# Patient Record
Sex: Male | Born: 1970 | Race: Black or African American | Hispanic: No | Marital: Married | Smoking: Never smoker
Health system: Southern US, Community
[De-identification: ages and names within clinical notes are randomized; demographics above are authoritative.]

## PROBLEM LIST (undated history)

## (undated) DIAGNOSIS — K219 Gastro-esophageal reflux disease without esophagitis: Secondary | ICD-10-CM

## (undated) DIAGNOSIS — I10 Essential (primary) hypertension: Secondary | ICD-10-CM

---

## 2008-04-20 ENCOUNTER — Emergency Department: Payer: Self-pay | Admitting: Emergency Medicine

## 2008-08-21 ENCOUNTER — Emergency Department: Payer: Self-pay | Admitting: Emergency Medicine

## 2010-05-27 IMAGING — CR DG CHEST 2V
1 series · 2 of 2 positions shown · non-contrast
Comparison: none

REASON FOR EXAM: dizzy  HTN
COMMENTS:

[Series 1: view not recorded · 0.17mm/px · 2 of 2 slices shown]
[im 1/2]
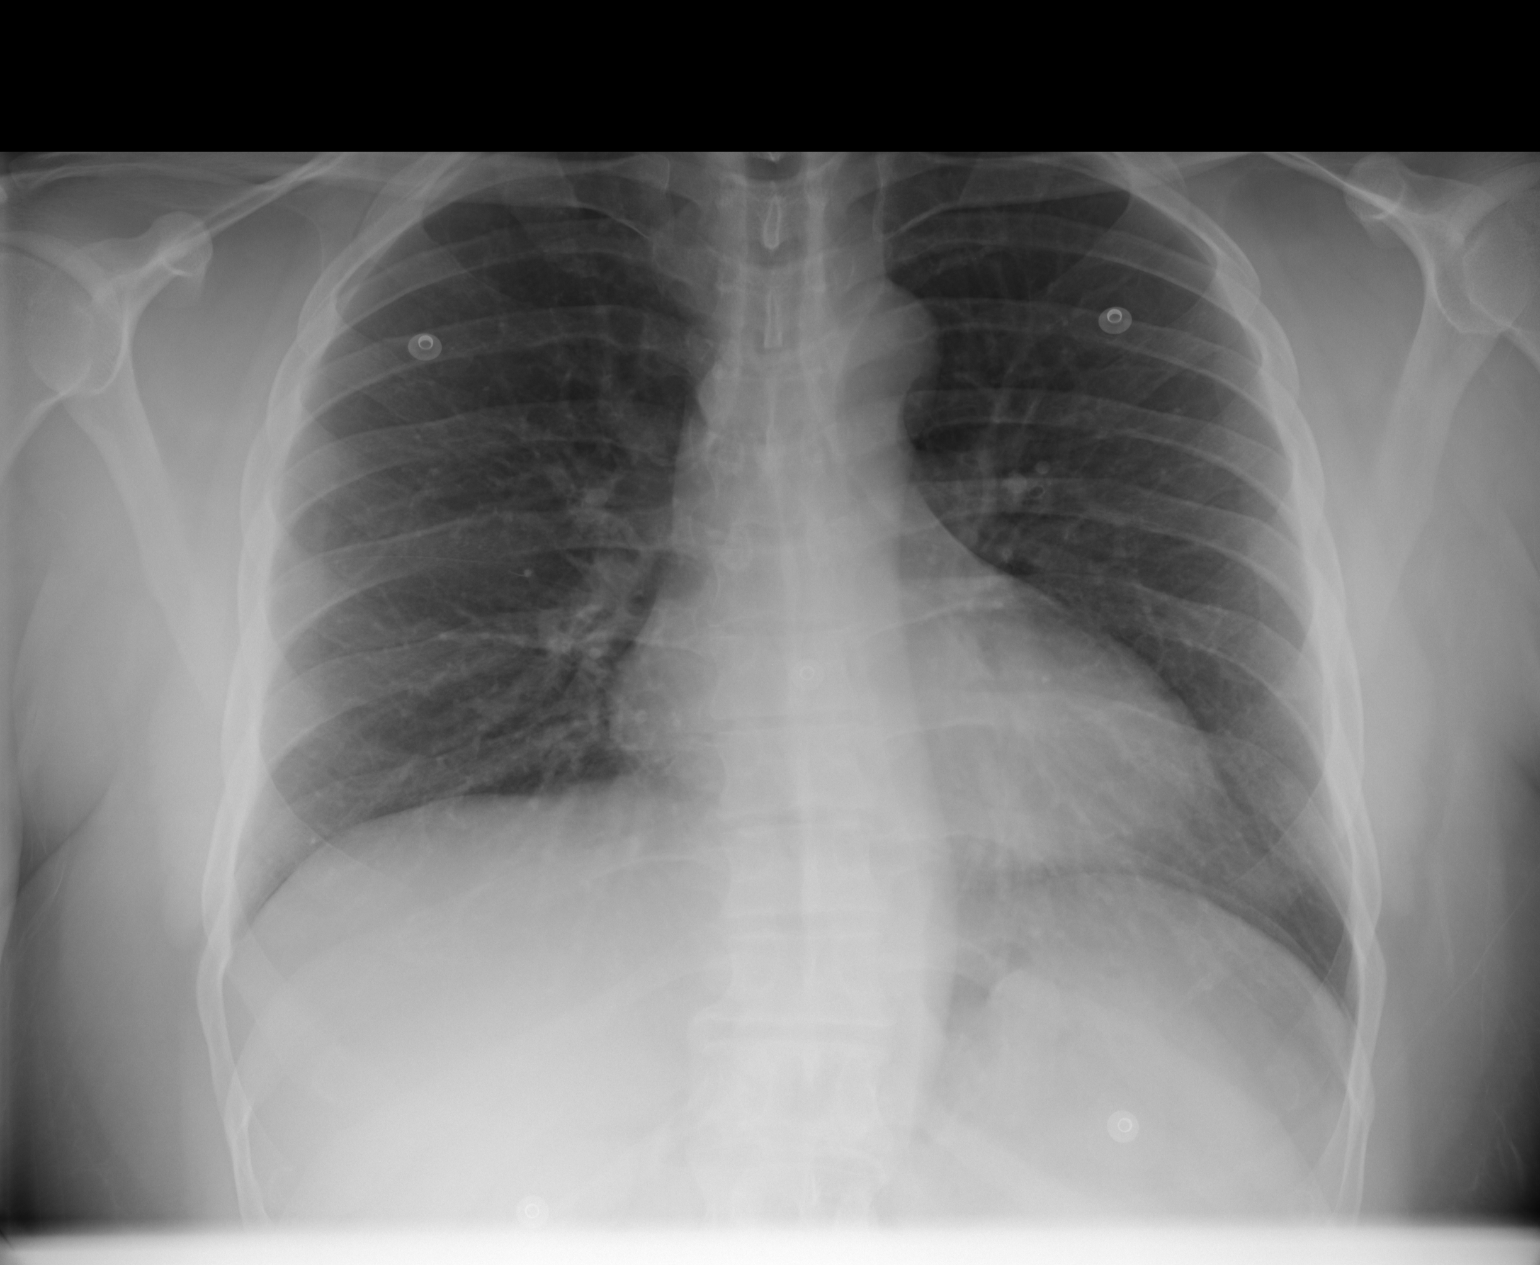
[im 2/2]
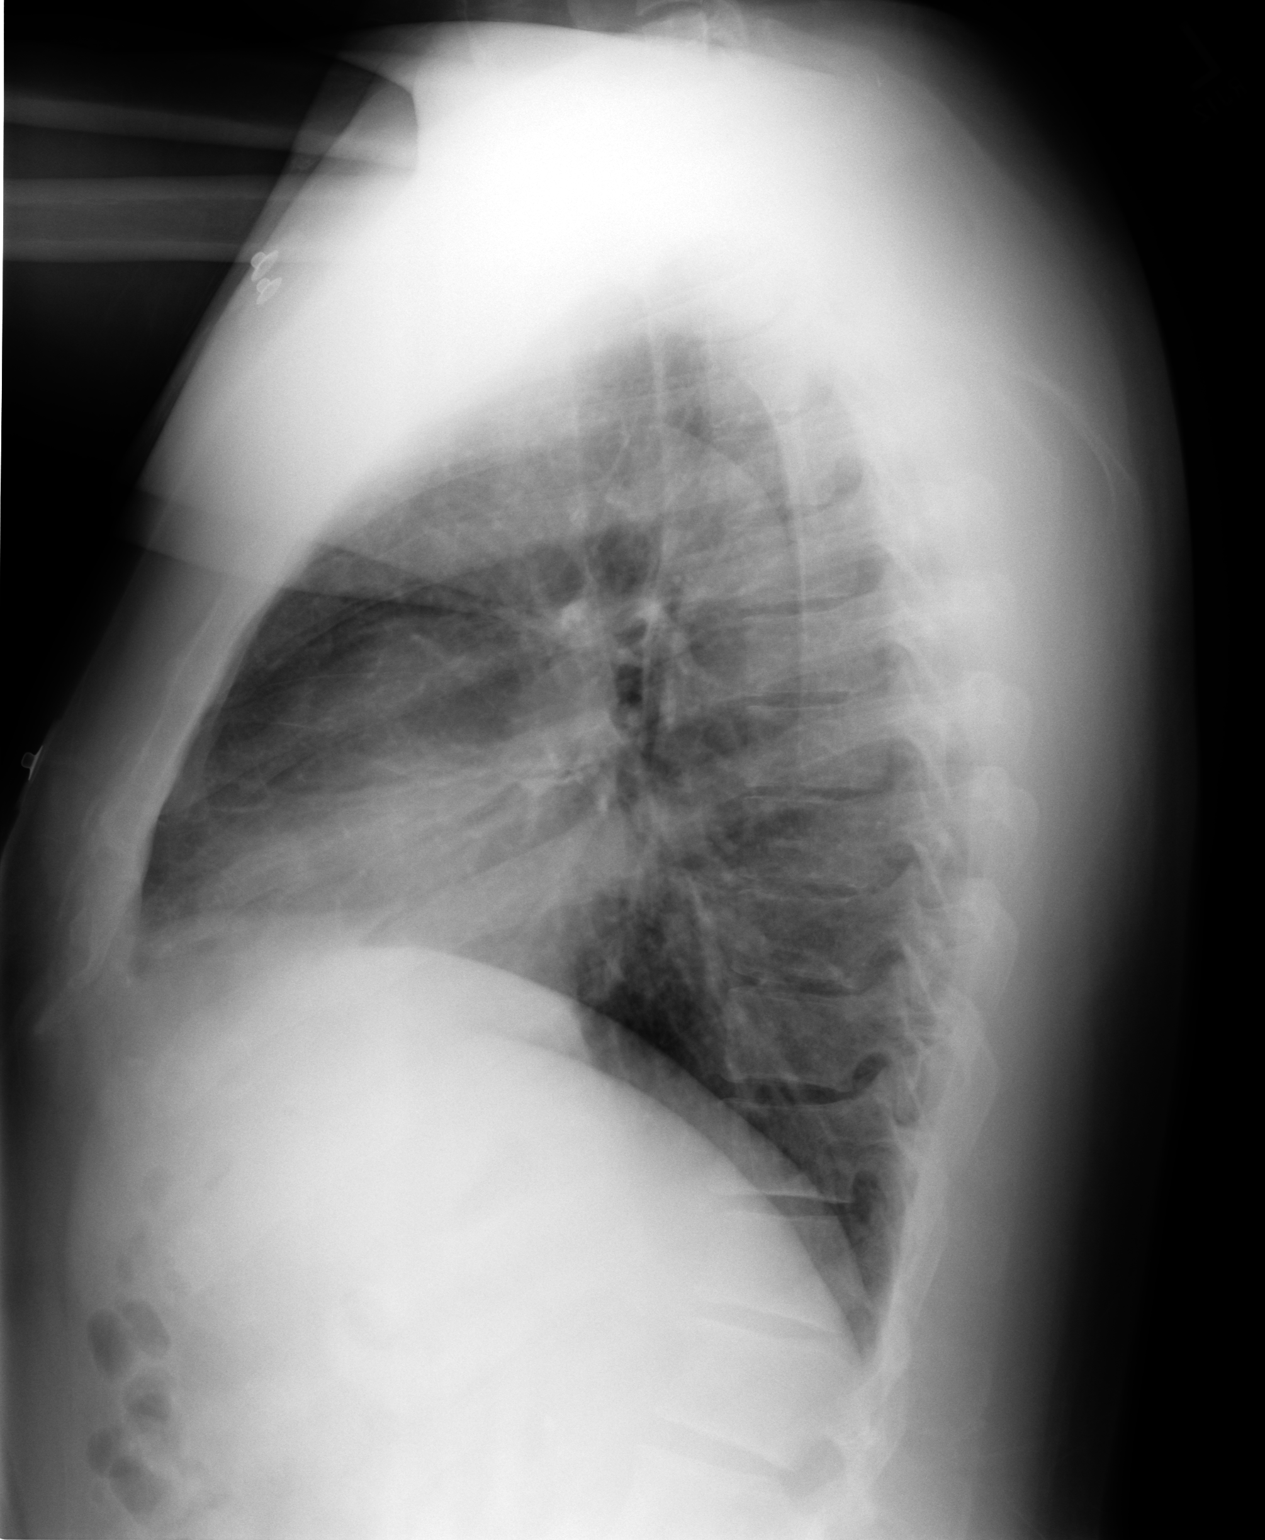

[2 of 2 positions shown; findings below may reference images not displayed]

PROCEDURE:     DXR - DXR CHEST PA (OR AP) AND LATERAL  - August 21, 2008 [DATE]

RESULT:     PA and lateral views of the chest are compared to a prior exam
of 04/20/2008. The lung fields are clear. No pneumonia, pneumothorax or
pleural effusion is seen. The heart is upper limits for normal in size or
slightly enlarged. The mediastinal and osseous structures show no acute
changes.
IMPRESSION: 1. The lung fields are clear.
2. The heart is upper limits for normal in size or slightly enlarged.

## 2015-04-07 ENCOUNTER — Emergency Department: Payer: BLUE CROSS/BLUE SHIELD

## 2015-04-07 ENCOUNTER — Emergency Department
Admission: EM | Admit: 2015-04-07 | Discharge: 2015-04-07 | Disposition: A | Discharge status: Home / Self Care | Payer: BLUE CROSS/BLUE SHIELD | Attending: Student | Admitting: Student

## 2015-04-07 ENCOUNTER — Encounter: Payer: Self-pay | Admitting: Emergency Medicine

## 2015-04-07 DIAGNOSIS — R0602 Shortness of breath: Secondary | ICD-10-CM | POA: Diagnosis not present

## 2015-04-07 DIAGNOSIS — R079 Chest pain, unspecified: Secondary | ICD-10-CM | POA: Insufficient documentation

## 2015-04-07 DIAGNOSIS — R000 Tachycardia, unspecified: Secondary | ICD-10-CM | POA: Diagnosis not present

## 2015-04-07 DIAGNOSIS — R197 Diarrhea, unspecified: Secondary | ICD-10-CM | POA: Diagnosis not present

## 2015-04-07 DIAGNOSIS — I10 Essential (primary) hypertension: Secondary | ICD-10-CM | POA: Diagnosis not present

## 2015-04-07 DIAGNOSIS — Z79899 Other long term (current) drug therapy: Secondary | ICD-10-CM | POA: Diagnosis not present

## 2015-04-07 HISTORY — DX: Essential (primary) hypertension: I10

## 2015-04-07 LAB — TROPONIN I: Troponin I: <0.03 ng/mL (ref <0.031)

## 2015-04-07 LAB — FIBRIN DERIVATIVES D-DIMER (ARMC ONLY): FIBRIN DERIVATIVES D-DIMER (ARMC): 233 (ref 0–499)

## 2015-04-07 LAB — BASIC METABOLIC PANEL
ANION GAP: 7 (ref 5–15)
BUN: 17 mg/dL (ref 6–20)
CO2: 22 mmol/L (ref 22–32)
CREATININE: 1.23 mg/dL (ref 0.61–1.24)
Calcium: 9 mg/dL (ref 8.9–10.3)
Chloride: 104 mmol/L (ref 101–111)
GLUCOSE: 159 mg/dL — AB (ref 65–99)
Potassium: 3.2 mmol/L — ABNORMAL LOW (ref 3.5–5.1)
Sodium: 133 mmol/L — ABNORMAL LOW (ref 135–145)

## 2015-04-07 LAB — CBC
HCT: 49 % (ref 40.0–52.0)
HEMOGLOBIN: 16.2 g/dL (ref 13.0–18.0)
MCH: 25.5 pg — ABNORMAL LOW (ref 26.0–34.0)
MCHC: 33.1 g/dL (ref 32.0–36.0)
MCV: 76.9 fL — ABNORMAL LOW (ref 80.0–100.0)
Platelets: 197 10*3/uL (ref 150–440)
RBC: 6.38 MIL/uL — ABNORMAL HIGH (ref 4.40–5.90)
RDW: 15.5 % — ABNORMAL HIGH (ref 11.5–14.5)
WBC: 7.8 10*3/uL (ref 3.8–10.6)

## 2015-04-07 MED ORDER — SODIUM CHLORIDE 0.9 % IV BOLUS (SEPSIS)
1000.0000 mL | Freq: Once | INTRAVENOUS | Status: AC
  Administered 2015-04-07: 1000 mL via INTRAVENOUS

## 2015-04-07 MED ORDER — ASPIRIN 81 MG PO CHEW
324.0000 mg | CHEWABLE_TABLET | Freq: Once | ORAL | Status: AC
  Administered 2015-04-07: 324 mg via ORAL
  Filled 2015-04-07: qty 4

## 2015-04-07 NOTE — ED Notes (Signed)
Patient return from x-ray 

## 2015-04-07 NOTE — ED Notes (Signed)
Pt had episode of central chest pain described as pulling feeling.  Lasted about 10 minutes with shob as well.  Pain has now resolved.  Also had nausea with episode.

## 2015-04-07 NOTE — ED Provider Notes (Signed)
[facility] Emergency Department Provider Note  ____________________________________________  Time seen: Approximately 2:03 PM  I have reviewed the triage vital signs and the nursing notes.   HISTORY  Chief Complaint Chest Pain    HPI Andrew Hansen is a 45 y.o. male hypertension, GERD, obesity who presents for evaluation of gradual onset central dull/pulling nonradiating nonexertional chest pain which began at approximately 1:30 PM, lasted 10 minutes, is now resolved, no modifying factors. The patient reports that at approximately 1:30 PM, he was shaving when he developed this chest pain. He reports that he also developed stomach upset and had an episode of nonbloody diarrhea. He felt somewhat short of breath during this time. His pain is since resolved. He reports that in the past he had a more severe episode of chest pain in the setting of dehydration but reports that he has been maintaining adequate hydration recently. He does have a family history of early coronary artery disease. No personal or family history of DVT or PE.   Past Medical History  Diagnosis Date  . Hypertension     There are no active problems to display for this patient.   History reviewed. No pertinent past surgical history.  Current Outpatient Rx  Name  Route  Sig  Dispense  Refill  . amLODipine (NORVASC) 5 MG tablet   Oral   Take 5 mg by mouth daily.      2   . lisinopril (PRINIVIL,ZESTRIL) 20 MG tablet   Oral   Take 20 mg by mouth 2 (two) times daily.      2   . omeprazole (PRILOSEC) 20 MG capsule   Oral   Take 20 mg by mouth daily as needed. For heartburn/Indigestion.      2     Allergies Other  History reviewed. No pertinent family history.  Social History Social History  Substance Use Topics  . Smoking status: Never Smoker   . Smokeless tobacco: None  . Alcohol Use: Yes    Review of Systems Constitutional: No fever/chills Eyes: No visual  changes. ENT: No sore throat. Cardiovascular: + chest pain. Respiratory: + shortness of breath. Gastrointestinal: No abdominal pain.  No nausea, no vomiting.  + diarrhea.  No constipation. Genitourinary: Negative for dysuria. Musculoskeletal: Negative for back pain. Skin: Negative for rash. Neurological: Negative for headaches, focal weakness or numbness.  10-point ROS otherwise negative.  ____________________________________________   PHYSICAL EXAM:  VITAL SIGNS: ED Triage Vitals  Enc Vitals Group     BP 04/07/15 1359 133/92 mmHg     Pulse Rate 04/07/15 1359 119     Resp 04/07/15 1359 20     Temp 04/07/15 1359 97.7 F (36.5 C)     Temp Source 04/07/15 1359 Oral     SpO2 04/07/15 1401 100 %     Weight 04/07/15 1358 263 lb (119.296 kg)     Height 04/07/15 1358 5\' 10"  (1.778 m)     Head Cir --      Peak Flow --      Pain Score --      Pain Loc --      Pain Edu? --      Excl. in GC? --     Constitutional: Alert and oriented. Well appearing and in no acute distress. Eyes: Conjunctivae are normal. PERRL. EOMI. Head: Atraumatic. Nose: No congestion/rhinnorhea. Mouth/Throat: Mucous membranes are moist.  Oropharynx non-erythematous. Neck: No stridor.  Cardiovascular: mildly tachycardic rate, regular rhythm. Grossly normal heart sounds.  Good peripheral circulation. Respiratory: Normal respiratory effort.  No retractions. Lungs CTAB. Gastrointestinal: Soft and nontender. No distention.  No CVA tenderness. Genitourinary: deferred Musculoskeletal: No lower extremity tenderness nor edema.  No joint effusions. Neurologic:  Normal speech and language. No gross focal neurologic deficits are appreciated. No gait instability. Skin:  Skin is warm, dry and intact. No rash noted. Psychiatric: Mood and affect are normal. Speech and behavior are normal.  ____________________________________________   LABS (all labs ordered are listed, but only abnormal results are displayed)  Labs  Reviewed  BASIC METABOLIC PANEL - Abnormal; Notable for the following:    Sodium 133 (*)    Potassium 3.2 (*)    Glucose, Bld 159 (*)    All other components within normal limits  CBC - Abnormal; Notable for the following:    RBC 6.38 (*)    MCV 76.9 (*)    MCH 25.5 (*)    RDW 15.5 (*)    All other components within normal limits  TROPONIN I  FIBRIN DERIVATIVES D-DIMER ([facility] ONLY)  TROPONIN I   ____________________________________________  EKG  ED ECG REPORT I, Gayla DossGayle, Fontaine Hehl A, the attending physician, personally viewed and interpreted this ECG.   Date: 04/07/2015  EKG Time: 13:59  Rate: 116  Rhythm: sinus tachycardia  Axis: normal  Intervals:right bundle branch block, incomplete  ST&T Change: No acute ST elevation.  EKG is unchanged when compared to 08/21/2008. ____________________________________________  RADIOLOGY  CXR IMPRESSION: Negative chest x-ray.  ____________________________________________   PROCEDURES  Procedure(s) performed: None  Critical Care performed: No  ____________________________________________   INITIAL IMPRESSION / ASSESSMENT AND PLAN / ED COURSE  Pertinent labs & imaging results that were available during my care of the patient were reviewed by me and considered in my medical decision making (see chart for details).  Andrew Hansen is a 45 y.o. male hypertension, GERD, obesity who presents for evaluation of gradual onset central dull/pulling nonradiating nonexertional chest pain which began at approximately 1:30 PM and is now resolved. On exam he was initially mild tachycardic however this resolved quickly to prior to any interventions being given. His vital signs are otherwise stable, he is afebrile. He denies any chest pain at this time. EKG is not consistent with acute ischemia and is essentially unchanged from prior. Plan for cardiac labs including 2 sets of troponins, d-dimer, chest x-ray. We'll give aspirin, reassess for  disposition.  ----------------------------------------- 6:31 PM on 04/07/2015 -----------------------------------------  The patient has remained chest pain-free throughout the duration of his stay in the emergency department. 2 sets of troponins are negative making ACS unlikely. D-dimer is not elevated, doubt PE. Because of the patient's family history of coronary artery disease I have discussed the case with Dr. Darrold JunkerParaschos of cardiology and the patient will follow up in clinic in 2 days. I discussed extensive return precautions with the patient as well as his wife and all are comfortable with the discharge plan. DC home. ____________________________________________   FINAL CLINICAL IMPRESSION(S) / ED DIAGNOSES  Final diagnoses:  Chest pain, unspecified chest pain type      Gayla DossEryka A Bennie Scaff, MD 04/07/15 [phone]

## 2015-04-07 NOTE — ED Notes (Signed)
Dr. Gayle at bedside  

## 2015-04-07 NOTE — ED Notes (Signed)
Patient transported to X-ray 

## 2016-03-09 ENCOUNTER — Encounter: Payer: Self-pay | Admitting: Emergency Medicine

## 2016-03-09 ENCOUNTER — Emergency Department
Admission: EM | Admit: 2016-03-09 | Discharge: 2016-03-09 | Disposition: A | Discharge status: Home / Self Care | Payer: 59 | Attending: Emergency Medicine | Admitting: Emergency Medicine

## 2016-03-09 ENCOUNTER — Emergency Department: Payer: 59

## 2016-03-09 DIAGNOSIS — Z79899 Other long term (current) drug therapy: Secondary | ICD-10-CM | POA: Diagnosis not present

## 2016-03-09 DIAGNOSIS — R079 Chest pain, unspecified: Secondary | ICD-10-CM | POA: Diagnosis present

## 2016-03-09 DIAGNOSIS — R531 Weakness: Secondary | ICD-10-CM | POA: Insufficient documentation

## 2016-03-09 DIAGNOSIS — I10 Essential (primary) hypertension: Secondary | ICD-10-CM | POA: Diagnosis not present

## 2016-03-09 HISTORY — DX: Gastro-esophageal reflux disease without esophagitis: K21.9

## 2016-03-09 LAB — TROPONIN I

## 2016-03-09 LAB — BASIC METABOLIC PANEL
ANION GAP: 7 (ref 5–15)
BUN: 17 mg/dL (ref 6–20)
CALCIUM: 9.1 mg/dL (ref 8.9–10.3)
CHLORIDE: 106 mmol/L (ref 101–111)
CO2: 24 mmol/L (ref 22–32)
CREATININE: 1.08 mg/dL (ref 0.61–1.24)
GLUCOSE: 112 mg/dL — AB (ref 65–99)
Potassium: 3.6 mmol/L (ref 3.5–5.1)
Sodium: 137 mmol/L (ref 135–145)

## 2016-03-09 LAB — CBC
HEMATOCRIT: 47 % (ref 40.0–52.0)
Hemoglobin: 15.7 g/dL (ref 13.0–18.0)
MCH: 25.4 pg — ABNORMAL LOW (ref 26.0–34.0)
MCHC: 33.4 g/dL (ref 32.0–36.0)
MCV: 76 fL — ABNORMAL LOW (ref 80.0–100.0)
PLATELETS: 188 10*3/uL (ref 150–440)
RBC: 6.18 MIL/uL — ABNORMAL HIGH (ref 4.40–5.90)
RDW: 14.8 % — AB (ref 11.5–14.5)
WBC: 6.5 10*3/uL (ref 3.8–10.6)

## 2016-03-09 LAB — TSH: TSH: 1.186 u[IU]/mL (ref 0.350–4.500)

## 2016-03-09 LAB — T4, FREE: FREE T4: 0.84 ng/dL (ref 0.61–1.12)

## 2016-03-09 NOTE — ED Provider Notes (Signed)
[facility] Emergency Department Provider Note        Time seen: ----------------------------------------- 3:40 PM on 03/09/2016 -----------------------------------------    I have reviewed the triage vital signs and the nursing notes.   HISTORY  Chief Complaint Weakness; Chest Pain; and Shortness of Breath    HPI Andrew Hansen is a 45 y.o. male who presents to ER for feeling weak and fatigued for the lastseveral weeks. Patient is had chest pain and shortness of breath off and on. He came here twice but did not check in because it resolved spontaneously. He was seen by his primary care doctor and referred to cardiology has had a negative echocardiogram. She denies fevers, chills, current chest pain or other complaints at this time. Patient states she is not under undue stress.   Past Medical History:  Diagnosis Date  . GERD (gastroesophageal reflux disease)   . Hypertension     There are no active problems to display for this patient.   History reviewed. No pertinent surgical history.  Allergies Other  Social History Social History  Substance Use Topics  . Smoking status: Never Smoker  . Smokeless tobacco: Never Used  . Alcohol use Yes    Review of Systems Constitutional: Negative for fever. Cardiovascular: Positive for chest pain Respiratory: Negative for shortness of breath. Gastrointestinal: Negative for abdominal pain, vomiting and diarrhea. Genitourinary: Negative for dysuria. Musculoskeletal: Negative for back pain. Skin: Negative for rash. Neurological: Negative for headaches, positive for generalized weakness  10-point ROS otherwise negative.  ____________________________________________   PHYSICAL EXAM:  VITAL SIGNS: ED Triage Vitals [03/09/16 1253]  Enc Vitals Group     BP 132/85     Pulse Rate 82     Resp 18     Temp 98.4 F (36.9 C)     Temp Source Oral     SpO2 98 %     Weight 258 lb (117 kg)     Height 5'  11" (1.803 m)     Head Circumference      Peak Flow      Pain Score      Pain Loc      Pain Edu?      Excl. in GC?     Constitutional: Alert and oriented. Well appearing and in no distress. Eyes: Conjunctivae are normal. PERRL. Normal extraocular movements. ENT   Head: Normocephalic and atraumatic.   Nose: No congestion/rhinnorhea.   Mouth/Throat: Mucous membranes are moist.   Neck: No stridor. Cardiovascular: Normal rate, regular rhythm. No murmurs, rubs, or gallops. Respiratory: Normal respiratory effort without tachypnea nor retractions. Breath sounds are clear and equal bilaterally. No wheezes/rales/rhonchi. Gastrointestinal: Soft and nontender. Normal bowel sounds Musculoskeletal: Nontender with normal range of motion in all extremities. No lower extremity tenderness nor edema. Neurologic:  Normal speech and language. No gross focal neurologic deficits are appreciated.  Skin:  Skin is warm, dry and intact. No rash noted. Psychiatric: Mood and affect are normal. Speech and behavior are normal.  ____________________________________________  EKG: Interpreted by me. Sinus rhythm rate of 82 bpm, normal PR interval, normal QT, normal axis, incomplete right bundle branch block.  ____________________________________________  ED COURSE:  Pertinent labs & imaging results that were available during my care of the patient were reviewed by me and considered in my medical decision making (see chart for details). Clinical Course   Patient is in no distress, we will assess with labs and imaging.  Procedures ____________________________________________   LABS (pertinent positives/negatives)  Labs Reviewed  BASIC METABOLIC PANEL - Abnormal; Notable for the following:       Result Value   Glucose, Bld 112 (*)    All other components within normal limits  CBC - Abnormal; Notable for the following:    RBC 6.18 (*)    MCV 76.0 (*)    MCH 25.4 (*)    RDW 14.8 (*)    All  other components within normal limits  TROPONIN I  TSH  T4, FREE    RADIOLOGY Chest x-ray is unremarkable  ____________________________________________  FINAL ASSESSMENT AND PLAN  Chest pain, weakness  Plan: Patient with labs and imaging as dictated above. No specific etiology for his symptoms. EKG and labs are not concerning, he was not orthostatic. Patient was discussed with cardiology will see him in the office for a Myoview.   Emily FilbertWilliams, Jonathan E, MD   Note: This dictation was prepared with Dragon dictation. Any transcriptional errors that result from this process are unintentional    Emily FilbertJonathan E Williams, MD 03/09/16 1616

## 2016-03-09 NOTE — ED Triage Notes (Signed)
Has had drained feeling for weeks.  Chest pain and sob on and off.  Came here twice but did not check in due to it went away.  He has been to pcp and to cardiologist but no results yet.

## 2017-01-10 IMAGING — CR DG CHEST 2V
2 series · 2 of 2 positions shown · non-contrast
Comparison: Prior chest x-ray 08/21/2008

CLINICAL DATA: 44-year-old male with central chest pain

EXAM:
CHEST  2 VIEW

[chest pa]
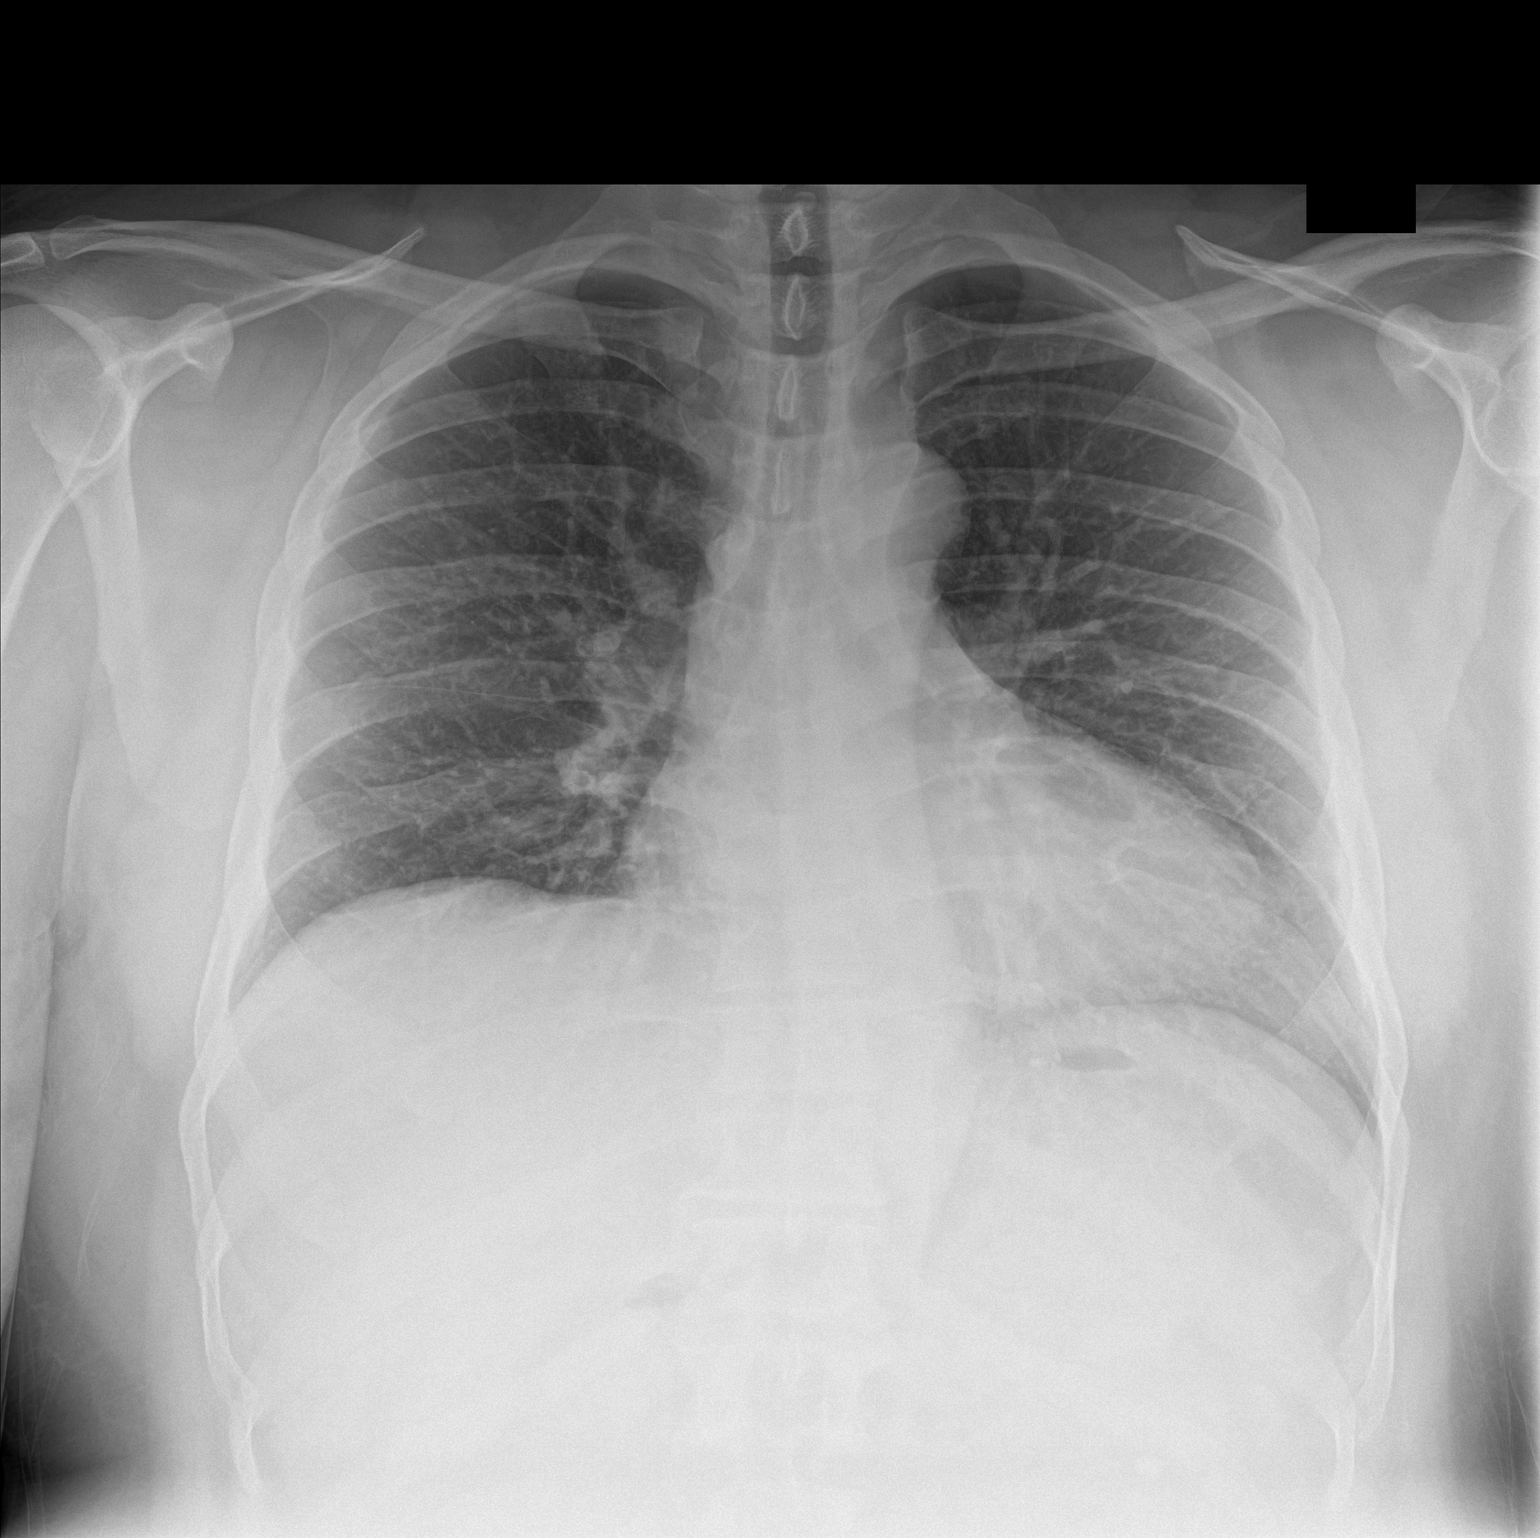

[chest lat]
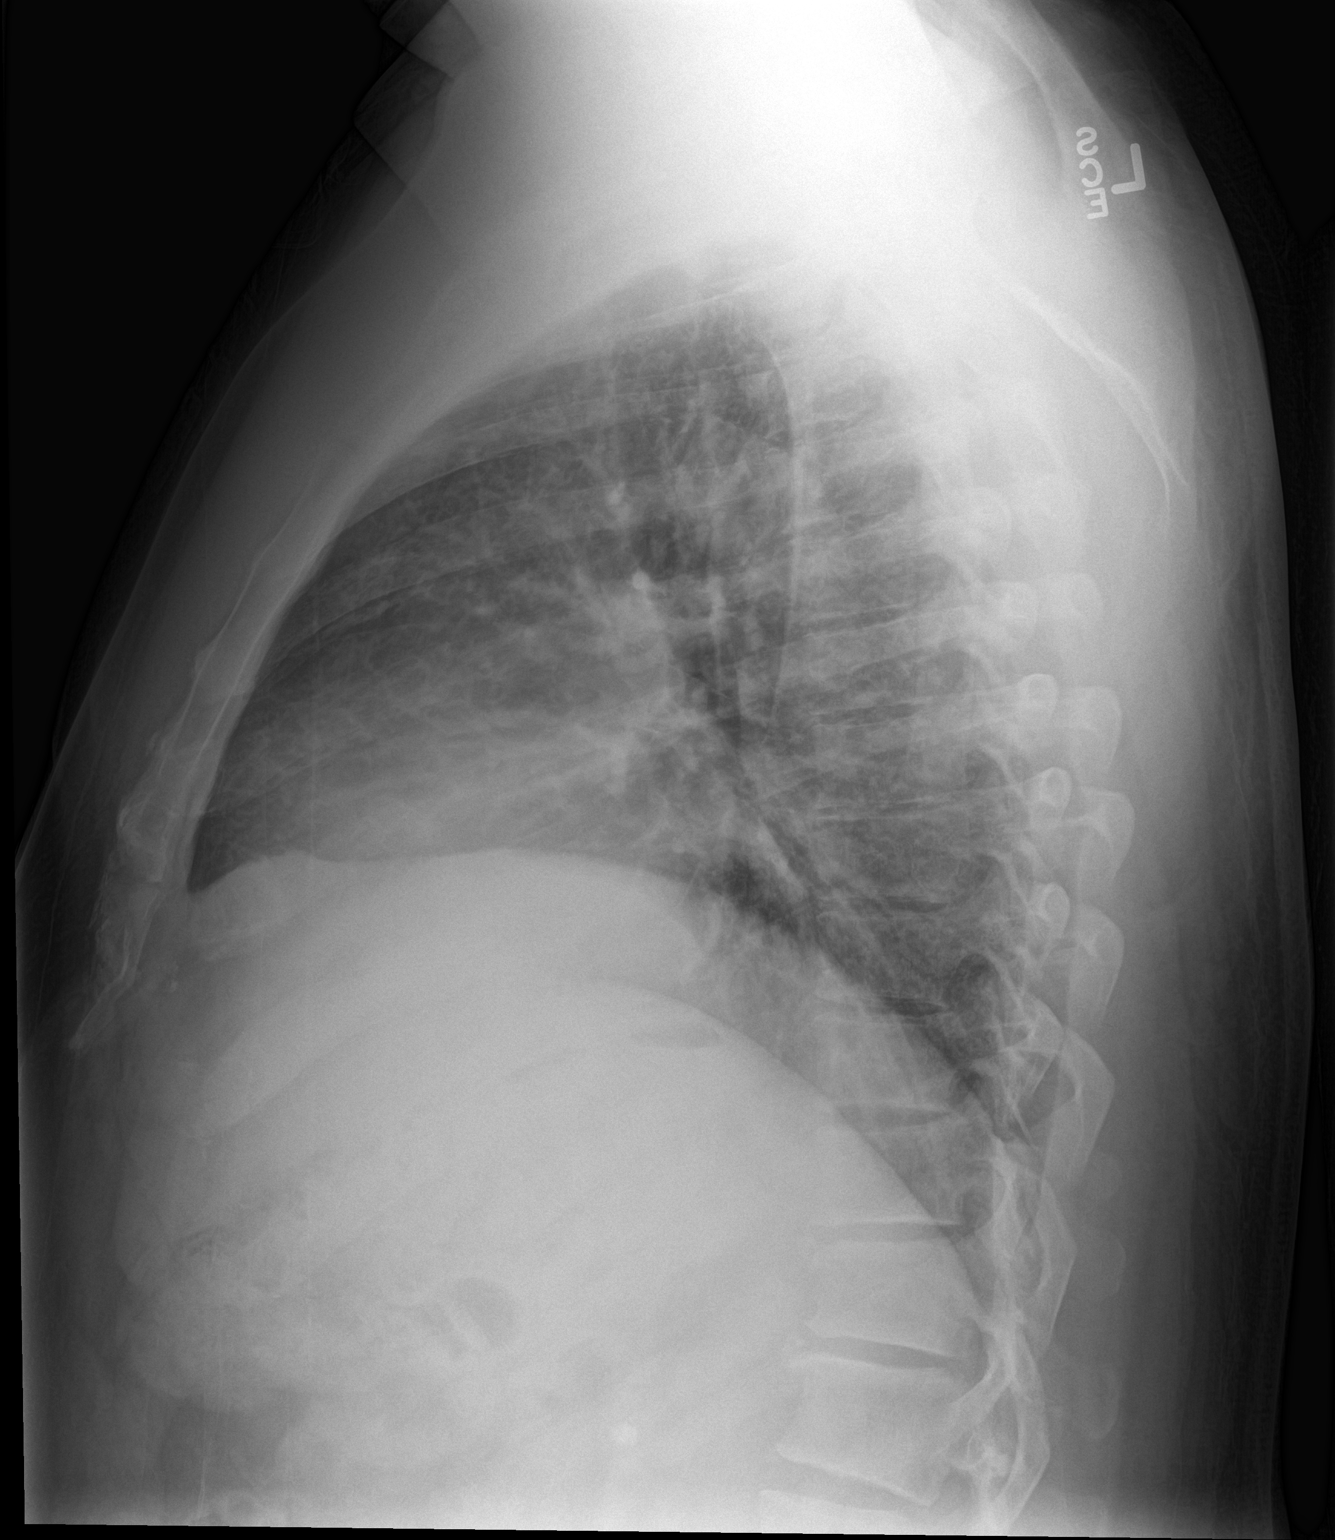

[2 of 2 positions shown; findings below may reference images not displayed]

FINDINGS: The lungs are clear and negative for focal airspace consolidation,
pulmonary edema or suspicious pulmonary nodule. No pleural effusion
or pneumothorax. Cardiac and mediastinal contours are within normal
limits. No acute fracture or lytic or blastic osseous lesions. The
visualized upper abdominal bowel gas pattern is unremarkable.
IMPRESSION: Negative chest x-ray.

## 2017-12-13 IMAGING — CR DG CHEST 2V
2 series · 2 of 2 positions shown · non-contrast
Comparison: Chest radiograph 04/07/2015

CLINICAL DATA: chest pain.  Shortness of breath

EXAM:
CHEST  2 VIEW

[chest pa]
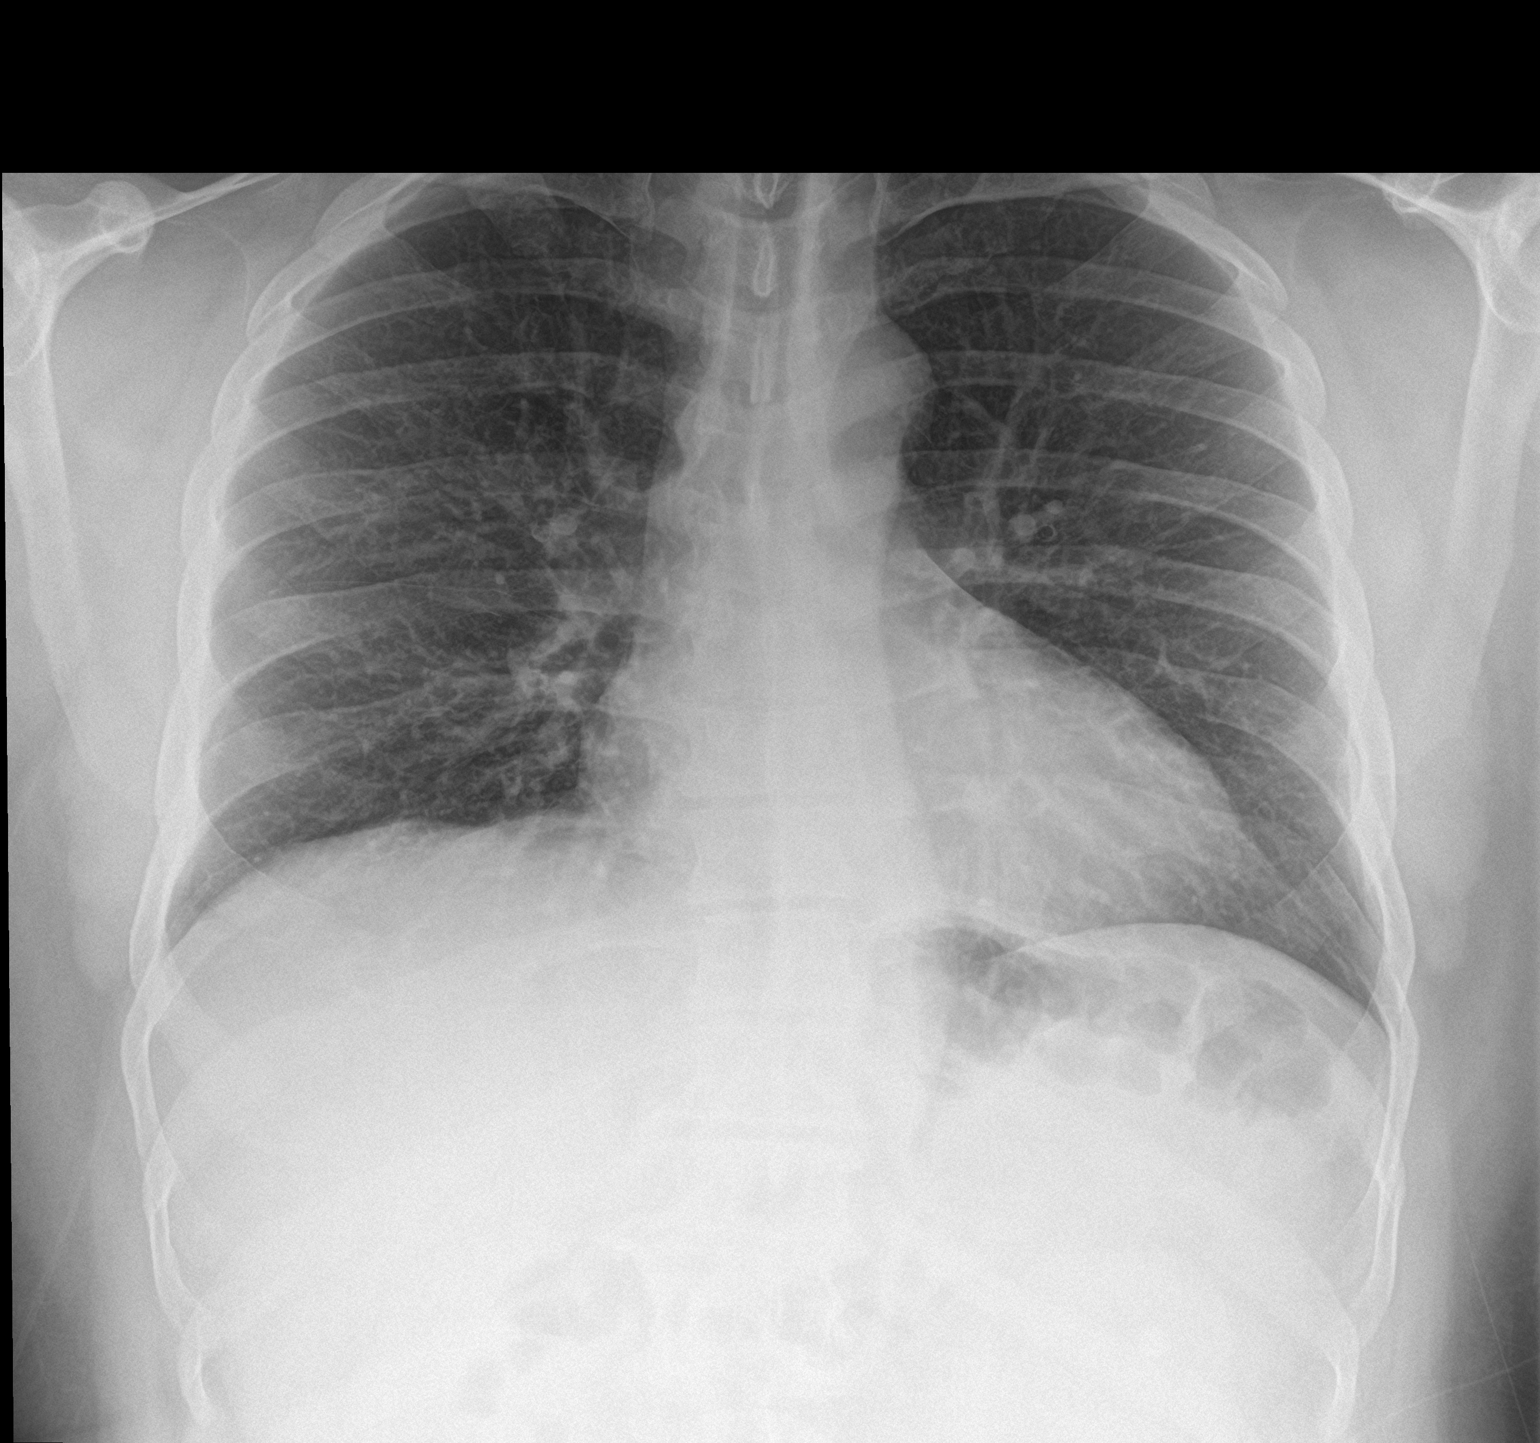

[chest lat]
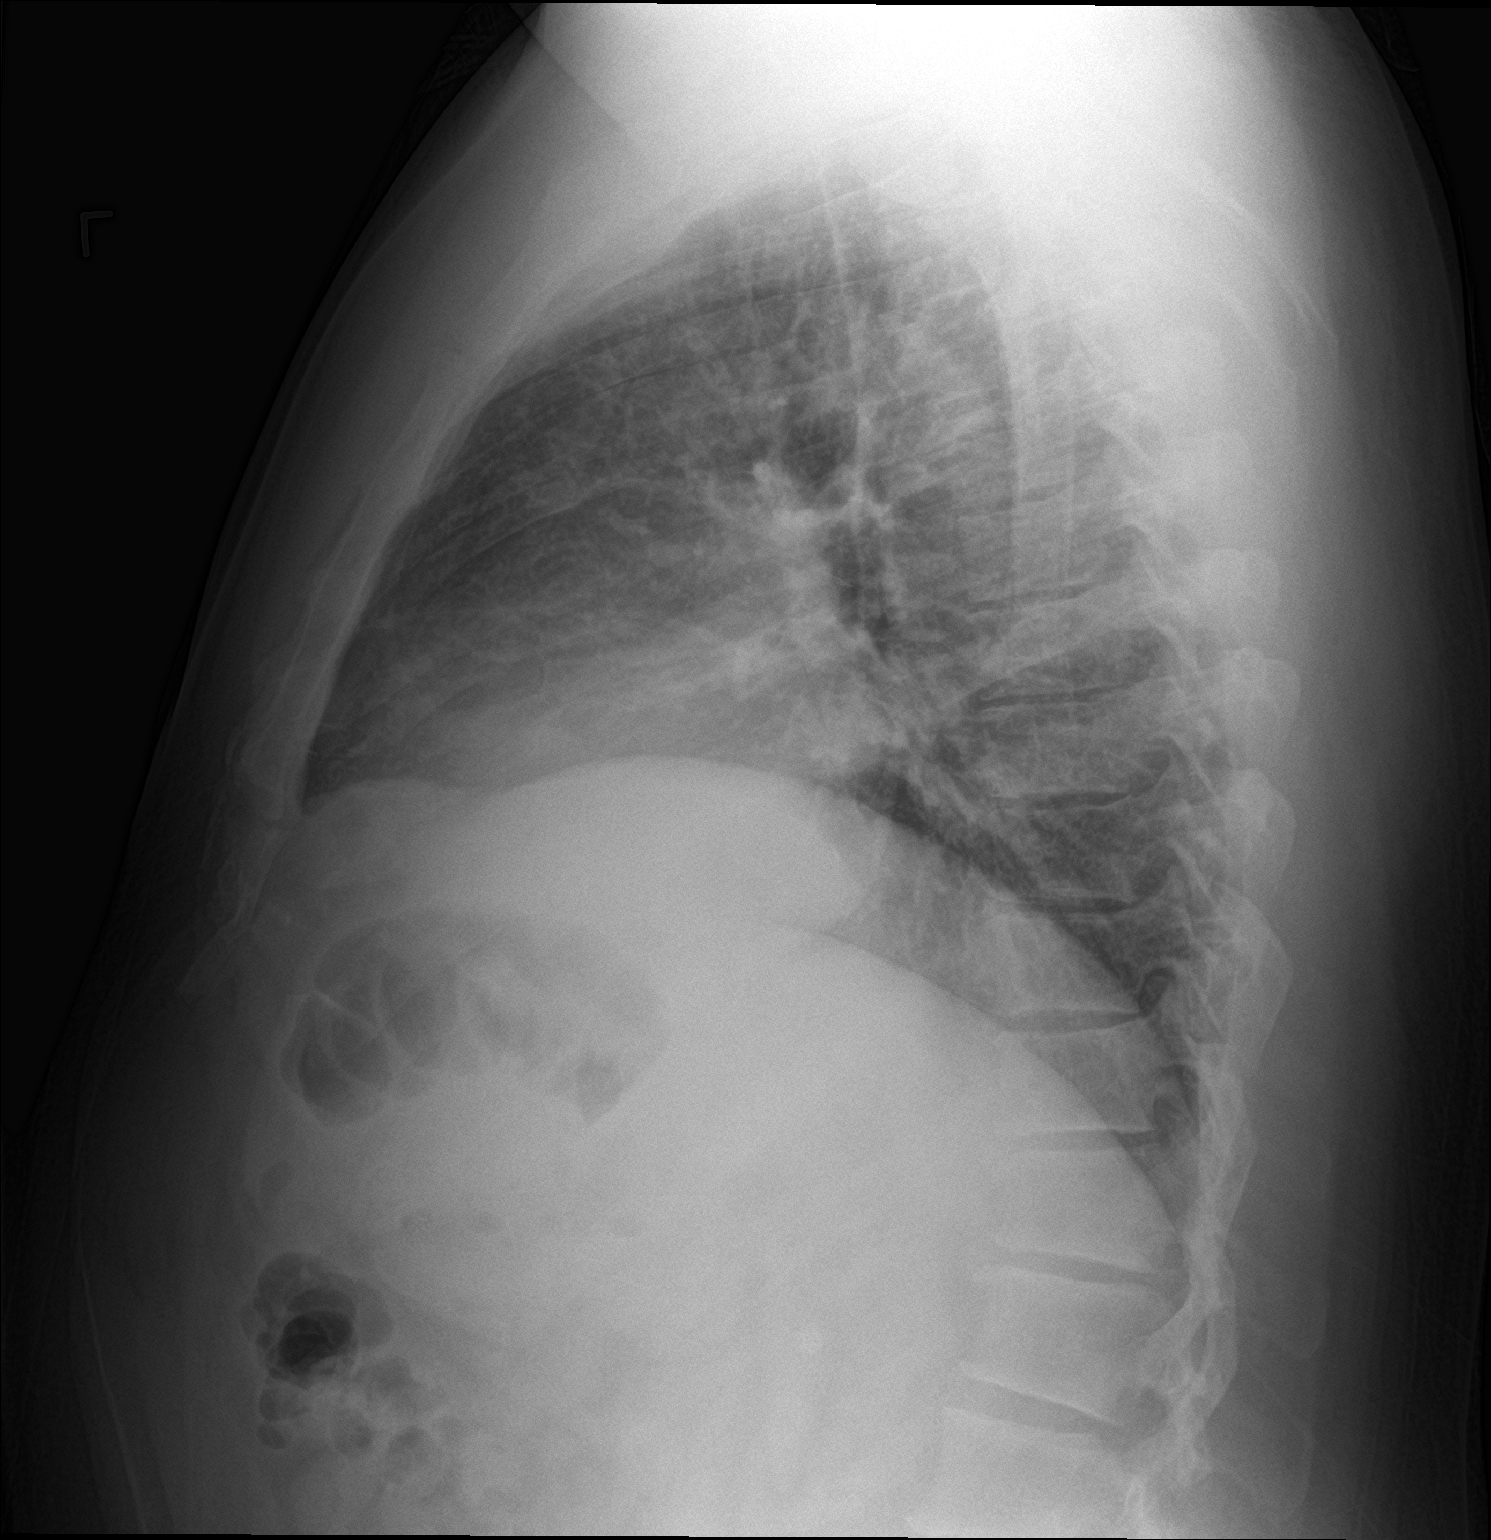

[2 of 2 positions shown; findings below may reference images not displayed]

FINDINGS: The heart size and mediastinal contours are within normal limits.
Both lungs are clear. The visualized skeletal structures are
unremarkable.
IMPRESSION: No active cardiopulmonary disease.

## 2018-10-27 ENCOUNTER — Telehealth: Payer: Self-pay

## 2018-10-27 ENCOUNTER — Other Ambulatory Visit: Payer: Self-pay

## 2018-10-27 DIAGNOSIS — Z20822 Contact with and (suspected) exposure to covid-19: Secondary | ICD-10-CM

## 2018-10-27 NOTE — Telephone Encounter (Signed)
Patient called with concerns due to his wife and son testing positive for COVID-19 and being in close contact. Patient advised to quarantine at home and self monitor. Patient requesting to be tested at this time.

## 2018-10-29 LAB — NOVEL CORONAVIRUS, NAA: SARS-CoV-2, NAA: NOT DETECTED

## 2018-10-31 ENCOUNTER — Telehealth: Payer: Self-pay

## 2018-10-31 NOTE — Telephone Encounter (Signed)
Call for update in reference to NEGATIVE COVID-19 results.

## 2019-01-03 ENCOUNTER — Ambulatory Visit: Payer: Self-pay

## 2019-01-04 ENCOUNTER — Ambulatory Visit: Payer: Self-pay

## 2019-01-04 ENCOUNTER — Other Ambulatory Visit: Payer: Self-pay

## 2019-01-04 DIAGNOSIS — Z23 Encounter for immunization: Secondary | ICD-10-CM

## 2019-01-04 NOTE — Progress Notes (Signed)
     Patient ID: Andrew Hansen, male    DOB: [birth year], 48 y.o.   MRN: [id number]    Thank you!!  Apolonio Schneiders RN  [facility]ist [place]: [phone]  Cell:  [phone] Website: [email or web address]

## 2019-01-04 NOTE — Patient Instructions (Signed)
Influenza (Flu) Vaccine (Inactivated or Recombinant): What You Need to Know 1. Why get vaccinated? Influenza vaccine can prevent influenza (flu). Flu is a contagious disease that spreads around the [place] every year, usually between October and May. Anyone can get the flu, but it is more dangerous for some people. Infants and young children, people 47 years of age and older, pregnant women, and people with certain health conditions or a weakened immune system are at greatest risk of flu complications. Pneumonia, bronchitis, sinus infections and ear infections are examples of flu-related complications. If you have a medical condition, such as heart disease, cancer or diabetes, flu can make it worse. Flu can cause fever and chills, sore throat, muscle aches, fatigue, cough, headache, and runny or stuffy nose. Some people may have vomiting and diarrhea, though this is more common in children than adults. Each year thousands of people in the [place] die from flu, and many more are hospitalized. Flu vaccine prevents millions of illnesses and flu-related visits to the doctor each year. 2. Influenza vaccine CDC recommends everyone 6 months of age and older get vaccinated every flu season. Children 6 months through 8 years of age may need 2 doses during a single flu season. Everyone else needs only 1 dose each flu season. It takes about 2 weeks for protection to develop after vaccination. There are many flu viruses, and they are always changing. Each year a new flu vaccine is made to protect against three or four viruses that are likely to cause disease in the upcoming flu season. Even when the vaccine doesn't exactly match these viruses, it may still provide some protection. Influenza vaccine does not cause flu. Influenza vaccine may be given at the same time as other vaccines. 3. Talk with your health care provider Tell your vaccine provider if the person getting the vaccine:  Has had an  allergic reaction after a previous dose of influenza vaccine, or has any severe, life-threatening allergies.  Has ever had Guillain-Barr Syndrome (also called GBS). In some cases, your health care provider may decide to postpone influenza vaccination to a future visit. People with minor illnesses, such as a cold, may be vaccinated. People who are moderately or severely ill should usually wait until they recover before getting influenza vaccine. Your health care provider can give you more information. 4. Risks of a vaccine reaction  Soreness, redness, and swelling where shot is given, fever, muscle aches, and headache can happen after influenza vaccine.  There may be a very small increased risk of Guillain-Barr Syndrome (GBS) after inactivated influenza vaccine (the flu shot). Young children who get the flu shot along with pneumococcal vaccine (PCV13), and/or DTaP vaccine at the same time might be slightly more likely to have a seizure caused by fever. Tell your health care provider if a child who is getting flu vaccine has ever had a seizure. People sometimes faint after medical procedures, including vaccination. Tell your provider if you feel dizzy or have vision changes or ringing in the ears. As with any medicine, there is a very remote chance of a vaccine causing a severe allergic reaction, other serious injury, or death. 5. What if there is a serious problem? An allergic reaction could occur after the vaccinated person leaves the clinic. If you see signs of a severe allergic reaction (hives, swelling of the face and throat, difficulty breathing, a fast heartbeat, dizziness, or weakness), call 9-1-1 and get the person to the nearest hospital. For other signs that   concern you, call your health care provider. Adverse reactions should be reported to the Vaccine Adverse Event Reporting System (VAERS). Your health care provider will usually file this report, or you can do it yourself. Visit the  VAERS website at www.vaers.hhs.gov or call 1-800-822-7967.VAERS is only for reporting reactions, and VAERS staff do not give medical advice. 6. The National Vaccine Injury Compensation Program The National Vaccine Injury Compensation Program (VICP) is a federal program that was created to compensate people who may have been injured by certain vaccines. Visit the VICP website at www.hrsa.gov/vaccinecompensation or call 1-800-338-2382 to learn about the program and about filing a claim. There is a time limit to file a claim for compensation. 7. How can I learn more?  Ask your healthcare provider.  Call your local or state health department.  Contact the Centers for Disease Control and Prevention (CDC): ? Call 1-800-232-4636 (1-800-CDC-INFO) or ? Visit CDC's www.cdc.gov/flu Vaccine Information Statement (Interim) Inactivated Influenza Vaccine (11/11/2017) This information is not intended to replace advice given to you by your health care provider. Make sure you discuss any questions you have with your health care provider. Document Released: 01/08/2006 Document Revised: 07/05/2018 Document Reviewed: 11/15/2017 Elsevier Patient Education  2020 [employer] Preventing Influenza, Adult Influenza, more commonly known as "the flu," is a viral infection that mainly affects the respiratory tract. The respiratory tract includes structures that help you breathe, such as the lungs, nose, and throat. The flu causes many common cold symptoms, as well as a high fever and body aches. The flu spreads easily from person to person (is contagious). The flu is most common from December through March. This is called flu season.You can catch the flu virus by:  Breathing in droplets from an infected person's cough or sneeze.  Touching something that was recently contaminated with the virus and then touching your mouth, nose, or eyes. What can I do to lower my risk?        You can decrease your risk of getting  the flu by:  Getting a flu shot (influenza vaccination) every year. This is the best way to prevent the flu. A flu shot is recommended for everyone age 6 months and older. ? It is best to get a flu shot in the fall, as soon as it is available. Getting a flu shot during winter or spring instead is still a good idea. Flu season can last into early spring. ? Preventing the flu through vaccination requires getting a new flu shot every year. This is because the flu virus changes slightly (mutates) from one year to the next. Even if a flu shot does not completely protect you from all flu virus mutations, it can reduce the severity of your illness and prevent dangerous complications of the flu. ? If you are pregnant, you can and should get a flu shot. ? If you have had a reaction to the shot in the past or if you are allergic to eggs, check with your health care provider before getting a flu shot. ? Sometimes the vaccine is available as a nasal spray. In some years, the nasal spray has not been as effective against the flu virus. Check with your health care provider if you have questions about this.  Practicing good health habits. This is especially important during flu season. ? Avoid contact with people who are sick with flu or cold symptoms. ? Wash your hands with soap and water often. If soap and water are not available, use alcohol-based   hand sanitizer. ? Avoid touching your hands to your face, especially when you have not washed your hands recently. ? Use a disinfectant to clean surfaces at home and at work that may be contaminated with the flu virus. ? Keep your body's disease-fighting system (immune system) in good shape by eating a healthy diet, drinking plenty of fluids, getting enough sleep, and exercising regularly. If you do get the flu, avoid spreading it to others by:  Staying home until your symptoms have been gone for at least one day.  Covering your mouth and nose when you cough or  sneeze.  Avoiding close contact with others, especially babies and elderly people. Why are these changes important? Getting a flu shot and practicing good health habits protects you as well as other people. If you get the flu, your friends, family, and co-workers are also at risk of getting it, because it spreads so easily to others. Each year, about 2 out of every 10 people get the flu. Having the flu can lead to complications, such as pneumonia, ear infection, and sinus infection. The flu also can be deadly, especially for babies, people older than age 47, and people who have serious long-term diseases. How is this treated? Most people recover from the flu by resting at home and drinking plenty of fluids. However, a prescription antiviral medicine may reduce your flu symptoms and may make your flu go away sooner. This medicine must be started within a few days of getting flu symptoms. You can talk with your health care provider about whether you need an antiviral medicine. Antiviral medicine may be prescribed for people who are at risk for more serious flu symptoms. This includes people who:  Are older than age 47.  Are pregnant.  Have a condition that makes the flu worse or more dangerous. Where to find more information  Centers for Disease Control and Prevention: www.cdc.gov/flu/index.htm  Flu.gov: www.flu.gov/prevention-vaccination  American Academy [employer]: familydoctor.org/familydoctor/en/kids/vaccines/preventing-the-flu.html Contact a health care provider if:  You have influenza and you develop new symptoms.  You have: ? Chest pain. ? Diarrhea. ? A fever.  Your cough gets worse, or you produce more mucus. Summary  The best way to prevent the flu is to get a flu shot every year in the fall.  Even if you get the flu after you have received the yearly vaccine, your flu may be milder and go away sooner because of your flu shot.  If you get the flu, antiviral  medicines that are started with a few days of symptoms may reduce your flu symptoms and may make your flu go away sooner.  You can also help prevent the flu by practicing good health habits. This information is not intended to replace advice given to you by your health care provider. Make sure you discuss any questions you have with your health care provider. Document Released: 03/31/2015 Document Revised: 02/26/2017 Document Reviewed: 11/23/2015 Elsevier Patient Education  2020 Else[employer] Inc.  

## 2019-06-16 ENCOUNTER — Ambulatory Visit: Payer: BC Managed Care – PPO | Attending: Internal Medicine

## 2019-06-16 DIAGNOSIS — Z23 Encounter for immunization: Secondary | ICD-10-CM

## 2019-06-16 NOTE — Progress Notes (Signed)
   Covid-19 Vaccination Clinic  Name:  Andrew Hansen    MRN: [id number] DOB: 08-05-1970  06/16/2019  Andrew Hansen was observed post Covid-19 immunization for 15 minutes without incident. He was provided with Vaccine Information Sheet and instruction to access the V-Safe system.   Andrew Hansen was instructed to call 911 with any severe reactions post vaccine: Marland Kitchen Difficulty breathing  . Swelling of face and throat  . A fast heartbeat  . A bad rash all over body  . Dizziness and weakness   Immunizations Administered    Name Date Dose VIS Date Route   Pfizer COVID-19 Vaccine 06/16/2019  1:20 PM 0.3 mL 03/10/2019 Intramuscular   Manufacturer: [employer], [employer]   Lot: [id number]   NDC: [address]-8

## 2019-07-07 ENCOUNTER — Ambulatory Visit: Payer: BC Managed Care – PPO | Attending: Internal Medicine

## 2019-07-07 DIAGNOSIS — Z23 Encounter for immunization: Secondary | ICD-10-CM

## 2019-07-07 NOTE — Progress Notes (Signed)
   Covid-19 Vaccination Clinic  Name:  Andrew Hansen    MRN: [id number] DOB: February 06, 1971  07/07/2019  [name] was observed post Covid-19 immunization for 15 minutes without incident. He was provided with Vaccine Information Sheet and instruction to access the V-Safe system.   [name] was instructed to call 911 with any severe reactions post vaccine: Marland Kitchen Difficulty breathing  . Swelling of face and throat  . A fast heartbeat  . A bad rash all over body  . Dizziness and weakness   Immunizations Administered    Name Date Dose VIS Date Route   Pfizer COVID-19 Vaccine 07/07/2019  2:23 PM 0.3 mL 03/10/2019 Intramuscular   Manufacturer: [employer], [employer]   Lot: [id number]   NDC: [address]-3
